# Patient Record
Sex: Female | Born: 1985 | Race: White | Hispanic: No | Marital: Married | Smoking: Never smoker
Health system: Southern US, Community
[De-identification: ages and names within clinical notes are randomized; demographics above are authoritative.]

## PROBLEM LIST (undated history)

## (undated) ENCOUNTER — Inpatient Hospital Stay (HOSPITAL_COMMUNITY): Payer: Self-pay

## (undated) DIAGNOSIS — K219 Gastro-esophageal reflux disease without esophagitis: Secondary | ICD-10-CM

## (undated) DIAGNOSIS — Z789 Other specified health status: Secondary | ICD-10-CM

## (undated) DIAGNOSIS — O139 Gestational [pregnancy-induced] hypertension without significant proteinuria, unspecified trimester: Secondary | ICD-10-CM

---

## 2005-10-28 ENCOUNTER — Ambulatory Visit: Payer: Self-pay | Admitting: Family Medicine

## 2005-12-31 ENCOUNTER — Ambulatory Visit: Payer: Self-pay | Admitting: Family Medicine

## 2011-05-11 ENCOUNTER — Inpatient Hospital Stay (HOSPITAL_COMMUNITY)
Admission: AD | Admit: 2011-05-11 | Discharge: 2011-05-11 | Disposition: A | Discharge status: Home / Self Care | Payer: BC Managed Care – PPO | Source: Ambulatory Visit | Attending: Obstetrics and Gynecology | Admitting: Obstetrics and Gynecology

## 2011-05-11 DIAGNOSIS — O139 Gestational [pregnancy-induced] hypertension without significant proteinuria, unspecified trimester: Secondary | ICD-10-CM | POA: Insufficient documentation

## 2011-05-11 LAB — LACTATE DEHYDROGENASE: LDH: 314 U/L — ABNORMAL HIGH (ref 94–250)

## 2011-05-11 LAB — COMPREHENSIVE METABOLIC PANEL
ALT: 25 U/L (ref 0–35)
Alkaline Phosphatase: 165 U/L — ABNORMAL HIGH (ref 39–117)
BUN: 13 mg/dL (ref 6–23)
CO2: 18 mEq/L — ABNORMAL LOW (ref 19–32)
GFR calc non Af Amer: >60 mL/min (ref >60)
Glucose, Bld: 90 mg/dL (ref 70–99)
Potassium: 4.3 mEq/L (ref 3.5–5.1)
Sodium: 135 mEq/L (ref 135–145)
Total Bilirubin: 0.3 mg/dL (ref 0.3–1.2)
Total Protein: 6.4 g/dL (ref 6.0–8.3)

## 2011-05-11 LAB — CBC
HCT: 34.8 % — ABNORMAL LOW (ref 36.0–46.0)
Hemoglobin: 12.4 g/dL (ref 12.0–15.0)
MCV: 89.5 fL (ref 78.0–100.0)
RDW: 12.7 % (ref 11.5–15.5)
WBC: 10.3 10*3/uL (ref 4.0–10.5)

## 2011-05-11 LAB — URIC ACID: Uric Acid, Serum: 3.6 mg/dL (ref 2.4–7.0)

## 2011-06-03 ENCOUNTER — Inpatient Hospital Stay (HOSPITAL_COMMUNITY)
Admission: AD | Admit: 2011-06-03 | Discharge: 2011-06-08 | DRG: 370 | Disposition: A | Discharge status: Home / Self Care | Payer: BC Managed Care – PPO | Source: Ambulatory Visit | Attending: Obstetrics and Gynecology | Admitting: Obstetrics and Gynecology

## 2011-06-03 DIAGNOSIS — D689 Coagulation defect, unspecified: Secondary | ICD-10-CM | POA: Diagnosis present

## 2011-06-03 DIAGNOSIS — I10 Essential (primary) hypertension: Secondary | ICD-10-CM | POA: Diagnosis present

## 2011-06-03 DIAGNOSIS — IMO0002 Reserved for concepts with insufficient information to code with codable children: Secondary | ICD-10-CM | POA: Diagnosis present

## 2011-06-03 DIAGNOSIS — D696 Thrombocytopenia, unspecified: Secondary | ICD-10-CM | POA: Diagnosis present

## 2011-06-03 DIAGNOSIS — O9912 Other diseases of the blood and blood-forming organs and certain disorders involving the immune mechanism complicating childbirth: Secondary | ICD-10-CM | POA: Diagnosis present

## 2011-06-03 LAB — URINALYSIS, ROUTINE W REFLEX MICROSCOPIC
Bilirubin Urine: NEGATIVE
Hgb urine dipstick: NEGATIVE
Ketones, ur: NEGATIVE mg/dL
Nitrite: NEGATIVE
Protein, ur: NEGATIVE mg/dL
Urobilinogen, UA: 0.2 mg/dL (ref 0.0–1.0)

## 2011-06-03 LAB — CBC
MCH: 31 pg (ref 26.0–34.0)
MCHC: 34.3 g/dL (ref 30.0–36.0)
MCV: 90.4 fL (ref 78.0–100.0)
Platelets: 130 10*3/uL — ABNORMAL LOW (ref 150–400)
RBC: 4.06 MIL/uL (ref 3.87–5.11)
RDW: 13 % (ref 11.5–15.5)

## 2011-06-03 LAB — URIC ACID: Uric Acid, Serum: 4.6 mg/dL (ref 2.4–7.0)

## 2011-06-03 LAB — COMPREHENSIVE METABOLIC PANEL
AST: 26 U/L (ref 0–37)
CO2: 20 mEq/L (ref 19–32)
Calcium: 9.5 mg/dL (ref 8.4–10.5)
Creatinine, Ser: 0.85 mg/dL (ref 0.50–1.10)
GFR calc Af Amer: >60 mL/min (ref >60)
GFR calc non Af Amer: >60 mL/min (ref >60)
Sodium: 131 mEq/L — ABNORMAL LOW (ref 135–145)
Total Protein: 6.4 g/dL (ref 6.0–8.3)

## 2011-06-04 ENCOUNTER — Other Ambulatory Visit: Payer: Self-pay | Admitting: Obstetrics and Gynecology

## 2011-06-04 LAB — CBC
HCT: 36.7 % (ref 36.0–46.0)
Platelets: 134 10*3/uL — ABNORMAL LOW (ref 150–400)
RBC: 4.1 MIL/uL (ref 3.87–5.11)
RDW: 12.9 % (ref 11.5–15.5)
WBC: 14.6 10*3/uL — ABNORMAL HIGH (ref 4.0–10.5)

## 2011-06-05 LAB — CBC
Hemoglobin: 9.4 g/dL — ABNORMAL LOW (ref 12.0–15.0)
Platelets: 105 10*3/uL — ABNORMAL LOW (ref 150–400)
RBC: 2.97 MIL/uL — ABNORMAL LOW (ref 3.87–5.11)
WBC: 13.4 10*3/uL — ABNORMAL HIGH (ref 4.0–10.5)

## 2011-06-05 LAB — RPR: RPR Ser Ql: NONREACTIVE

## 2011-06-08 LAB — COMPREHENSIVE METABOLIC PANEL
ALT: 35 U/L (ref 0–35)
Alkaline Phosphatase: 142 U/L — ABNORMAL HIGH (ref 39–117)
CO2: 22 mEq/L (ref 19–32)
Chloride: 101 mEq/L (ref 96–112)
GFR calc Af Amer: >60 mL/min (ref >60)
GFR calc non Af Amer: >60 mL/min (ref >60)
Glucose, Bld: 81 mg/dL (ref 70–99)
Potassium: 4 mEq/L (ref 3.5–5.1)
Sodium: 133 mEq/L — ABNORMAL LOW (ref 135–145)
Total Bilirubin: 0.1 mg/dL — ABNORMAL LOW (ref 0.3–1.2)

## 2011-06-08 LAB — CBC
Hemoglobin: 9 g/dL — ABNORMAL LOW (ref 12.0–15.0)
RBC: 2.98 MIL/uL — ABNORMAL LOW (ref 3.87–5.11)

## 2011-06-12 NOTE — Op Note (Signed)
NAMESHAUGHNESSY, GETHERS          ACCOUNT NO.:  [id number]  MEDICAL RECORD NO.:  [id number]  LOCATION:  9131                          FACILITY:  [facility]  PHYSICIAN:  Duke Salvia. Marcelle Overlie, M.D.DATE OF BIRTH:  [birth year]  DATE OF PROCEDURE:  06/04/2011 DATE OF DISCHARGE:                              OPERATIVE REPORT   PREOPERATIVE DIAGNOSES: 1. 37+ week intrauterine pregnancy. 2. Chronic hypertension. 3. Mild preeclampsia. 4. Failed induction. 5. Fetal intolerance to labor.  POSTOPERATIVE DIAGNOSES: 1. 37+ week intrauterine pregnancy. 2. Chronic hypertension. 3. Mild preeclampsia. 4. Failed induction. 5. Fetal intolerance to labor.  PROCEDURE:  Primary low transverse cesarean section.  SURGEON:  Duke Salvia. Marcelle Overlie, MD.  ANESTHESIA:  Spinal.  COMPLICATIONS:  None.  DRAINS:  Foley catheter.  BLOOD LOSS:  700 mL.  INDICATION:  This patient was admitted for induction at 37+ weeks with a platelet count that was low normal at 130,000, BP 160/100, on labetalol, complaining of headache.  She received Cytotec overnight and Pitocin during the day.  In the afternoon of day #1 of induction, she had some late decelerations on Pitocin, she was given O2 and Pitocin was discontinued.  She was still long and close, at that point, we discussed further attempts of Pitocin induction.  The patient and her husband were ready for cesarean section for failed induction at that point.  DESCRIPTION OF PROCEDURE:  The patient was taken to the operating room after adequate level of spinal anesthetic was obtained.  With the patient in left tilt position, the abdomen was prepped and draped in usual manner for sterile bowel procedures.  Foley catheter positioned and draining clear urine.  Transverse incision was made two fingerbreadths above the symphysis, carried down to the fascia which was incised and extended transversely.  Rectus muscle was divided in the midline.  Peritoneum entered superiorly  without incident and extended in a vertical fashion.  The vesicouterine serosa was then incised and the bladder was bluntly and sharply dissected below.  Bladder blade was repositioned.  Transverse incision made in the lower uterine segment. Clear fluid noted, extended with blunt dissection.  The patient delivered of a female, Apgars 4 and 8, cord pH 7.20, weight 6 pounds 10 ounces, in the OP presentation.  This required partial rotation followed by VE to easily extract the vertex for delivery.  The infant was suctioned, cord was clamped, and passed to pediatric team for further care.  Placenta was then delivered manually intact, sent to pathology. Uterus exteriorized, cavity wiped clean with laparotomy pack.  Closure obtained with first layer of 0-chromic in a locked fashion followed by an imbricating layer of 0-chromic.  This was hemostatic.  Tubes and ovaries were normal.  The bladder flap area was intact and hemostatic. Clear urine noted at that point.  Prior to closure, sponge, needle, and instrument counts reported as correct x2.  Peritoneum closed with running 2-0 Vicryl suture.  2-0 Vicryl interrupted sutures used to approximate the rectus muscles in the midline.  Zero PDS suture was then used to close the fascia from laterally to midline on either side. Subcutaneous tissue was irrigated and noted be hemostatic.  A running 3- 0 Vicryl suture was used to reapproximate the  dead space in the subcu which was hemostatic.  A 4-0 Monocryl subcuticular suture with excellent closure of the skin.  She did receive Ancef 1 gram IV preop and Pitocin IV after the cord was clamped.  Mother and baby doing well at that point.     Lexton Hidalgo M. Marcelle Overlie, M.D.     RMH/MEDQ  D:  06/04/2011  T:  06/05/2011  Job:  [id number]  Electronically Signed by Richarda Overlie M.D. on 06/12/2011 09:49:58 AM

## 2011-06-17 NOTE — Discharge Summary (Signed)
Diana Burnett, Diana Burnett          ACCOUNT NO.:  [id number]  MEDICAL RECORD NO.:  [id number]  LOCATION:  9131                          FACILITY:  [facility]  PHYSICIAN:  Guy Sandifer. Henderson Cloud, M.D. DATE OF BIRTH:  1986-05-19  DATE OF ADMISSION:  06/03/2011 DATE OF DISCHARGE:  06/08/2011                              DISCHARGE SUMMARY   ADMITTING DIAGNOSES: 1. Intrauterine pregnancy at 37-2/7 weeks estimated gestational age. 2. Chronic hypertension. 3. Suspicion for preeclampsia. 4. Induction of labor.  DISCHARGE DIAGNOSES: 1. Status post low transverse cesarean section as secondary to failed     induction and fetal intolerance to labor. 2. Viable female infant.  PROCEDURE:  Primary low transverse cesarean section.  REASON FOR ADMISSION:  Please see dictated H&P.  HOSPITAL COURSE:  The patient is a 25 year old white married female, primigravida was admitted to [facility] at 37-2/7 weeks estimated gestational age for induction of labor.  The patient's pregnancy had been complicated by elevation in blood pressure.  The patient was started on labetalol for control and on the day of admission, blood pressure was noted to be 160/100.  The patient did have trace of proteinuria and was complaining of worsening headache.  The patient was then sent over to MAU for further evaluation.  Her PIH labs did reveal a low platelet count of 130,000 and liver function tests were within normal limits.  Due to worsening blood pressures at 37+ weeks and slightly low platelet count, decision was made to admit the patient for induction of labor.  That evening the patient was given Cytotec and the following morning the patient was started on Pitocin for augmentation of her labor.  Later in the afternoon, late decelerations were noted on the monitor which did recover with O2 administration, cessation of the Pitocin and positional change.  However, decision was made to proceed with a primary low  transverse cesarean section.  The patient was now transferred to the operating room where spinal anesthesia was administered without difficulty.  A low transverse incision was made with the delivery of a viable female infant weighing 6 pounds 10 ounces with Apgar's of 4 at 1 minute, 8 at 5 minutes.  Arterial cord pH was 7.20.  The patient tolerated the procedure well and taken to the recovery room in stable condition.  On the following morning, baby had been transferred to the NICU as the baby had coded in the room while mom was changing the diaper. Neuro evaluation was being performed.  Mother was tearful.Her vital signs were stable and she was afebrile.  Abdomen was soft, fundus firm and nontender.  Abdominal dressing noted to be clean, dry, and intact. Foley had been discontinued.  However, she did not void at the time of rounding.  Laboratory findings revealed hemoglobin 9.4, platelet count of 105,000.  The patient was started on Zoloft on the following morning.  The patient was without complaint.  Baby remained in the NICU. Abdomen soft.  Fundus firm and nontender.  Hemoglobin was noted to be 9.4.  On postoperative day #3, the patient was without complaint.  Vital signs were stable.  Fundus firm and nontender.  Incision was clean, dry and intact.  She is ambulating well.  On postoperative day #4, the patient complained of some pedal edema.  She denied headache or blurred vision, right upper quadrant pain.  Baby was stable in the NICU and the code was thought to be related to low blood sugar.  The patient's vital signs were stable.  Blood pressures were 120/81 to 144/86.  Deep tendon reflexes were 1+, no clonus, 2+ pedal edema was observed.  Abdomen soft with good return of bowel function.  Fundus firm and nontender. Incision was clean, dry, and intact with subcuticular closure.  PIH labs were drawn prior to the patient's discharge. Instructions were reviewed and the patient was later  discharged home.  CONDITION ON DISCHARGE:  Stable.  DIET:  Regular as tolerated.  ACTIVITY:  No heavy lifting, no driving x2 weeks, no vaginal entry.  FOLLOWUP:  The patient to follow up in the office in 3-4 days for an incision check.  She is to call for temperature greater than 100 degrees, persistent nausea, vomiting, heavy vaginal bleeding and/or redness or drainage from incisional site.  DISCHARGE MEDICATIONS: 1. Zoloft 25 mg 1 p.o. daily. 2. Labetalol 200 mg 1 p.o. b.i.d. 3. Percocet 5/325, #30, 1 p.o. every 4-6 h. p.r.n. 4. Prenatal vitamins 1 p.o. daily.     Julio Sicks, N.P.   ______________________________ Guy Sandifer. Henderson Cloud, M.D.    CC/MEDQ  D:  06/08/2011  T:  06/08/2011  Job:  [id number]  Electronically Signed by Julio Sicks N.P. on 06/09/2011 08:37:11 AM Electronically Signed by Harold Hedge M.D. on 06/17/2011 08:03:01 AM

## 2011-06-22 ENCOUNTER — Inpatient Hospital Stay (HOSPITAL_COMMUNITY)
Admission: AD | Admit: 2011-06-22 | Payer: BC Managed Care – PPO | Source: Home / Self Care | Admitting: Obstetrics and Gynecology

## 2011-06-29 NOTE — H&P (Signed)
  NAME:  MAKHIA, VOSLER NO.:  [id number]  MEDICAL RECORD NO.:  [id number]  LOCATION:                                 FACILITY:  PHYSICIAN:  Juluis Mire, M.D.        DATE OF BIRTH:  DATE OF ADMISSION:  06/03/2011 DATE OF DISCHARGE:                             HISTORY & PHYSICAL   The patient is a 25 year old primigravida female, estimated date of confinement is July 16.  This gives her estimated gestational age of 12- 2/7.  Her prenatal course has been complicated by elevated blood pressures.  She has been on increasing doses of labetalol.  She was seen today in the office where her blood pressure was 160/100.  She only had a trace of protein but was complaining of worsening headaches.  She was sent over to MAU for evaluation.  Her PIH labs did reveal a low platelet count of 130,000.  Liver function tests were all normal.  In view of the worsening blood pressure at 37+ weeks and slightly low platelet count, decision was to admit the patient for induction of labor.  In terms of allergies, has no known drug allergies.  MEDICATIONS: 1. Labetalol 100 mg b.i.d. 2. Prenatal vitamins.  For past medical history, family history, and social history, please see prenatal records.  REVIEW OF SYSTEMS:  Noncontributory.  PHYSICAL EXAMINATION:  VITAL SIGNS:  Again, the patient's blood pressure in the office was 160/100.  Her vital signs are stable. LUNGS:  Clear. CARDIOVASCULAR:  Regular rhythm and rate.  There are no murmurs or gallops. ABDOMEN:  Gravid uterus consistent with dates. PELVIC:  Cervix is long and closed. EXTREMITIES:  Trace edema. NEUROLOGIC:  Grossly within normal limits.  Deep tendon reflexes 2+ with no clonus.  IMPRESSION: 1. Intrauterine pregnancy at 37-2/7 with chronic hypertension versus     preeclampsia. 2. Mild thrombocytopenia.  PLAN:  In view of the patient's platelets and elevated blood pressure of 37 plus weeks, the decision was to  bring the patient in for Cytotec ripening of the cervix and subsequent induction of labor.  Her group B strep is negative.     Juluis Mire, M.D.     JSM/MEDQ  D:  06/03/2011  T:  06/04/2011  Job:  [id number]  Electronically Signed by Richardean Chimera M.D. on 06/29/2011 06:10:34 AM

## 2014-10-25 ENCOUNTER — Other Ambulatory Visit: Payer: Self-pay | Admitting: Obstetrics and Gynecology

## 2014-10-30 LAB — CYTOLOGY - PAP

## 2015-07-30 ENCOUNTER — Other Ambulatory Visit: Payer: Self-pay | Admitting: Obstetrics and Gynecology

## 2015-07-31 LAB — CYTOLOGY - PAP

## 2015-10-10 ENCOUNTER — Inpatient Hospital Stay (HOSPITAL_COMMUNITY)
Admission: AD | Admit: 2015-10-10 | Discharge: 2015-10-10 | Disposition: A | Discharge status: Home / Self Care | Payer: Commercial Managed Care - PPO | Source: Ambulatory Visit | Attending: Obstetrics and Gynecology | Admitting: Obstetrics and Gynecology

## 2015-10-10 ENCOUNTER — Encounter (HOSPITAL_COMMUNITY): Payer: Self-pay | Admitting: *Deleted

## 2015-10-10 ENCOUNTER — Inpatient Hospital Stay (HOSPITAL_COMMUNITY): Payer: Commercial Managed Care - PPO

## 2015-10-10 DIAGNOSIS — S3991XA Unspecified injury of abdomen, initial encounter: Secondary | ICD-10-CM

## 2015-10-10 DIAGNOSIS — O9A219 Injury, poisoning and certain other consequences of external causes complicating pregnancy, unspecified trimester: Secondary | ICD-10-CM

## 2015-10-10 DIAGNOSIS — O34219 Maternal care for unspecified type scar from previous cesarean delivery: Secondary | ICD-10-CM | POA: Insufficient documentation

## 2015-10-10 DIAGNOSIS — O26892 Other specified pregnancy related conditions, second trimester: Secondary | ICD-10-CM | POA: Diagnosis present

## 2015-10-10 DIAGNOSIS — Z3A20 20 weeks gestation of pregnancy: Secondary | ICD-10-CM | POA: Insufficient documentation

## 2015-10-10 DIAGNOSIS — R109 Unspecified abdominal pain: Secondary | ICD-10-CM | POA: Insufficient documentation

## 2015-10-10 DIAGNOSIS — O9A212 Injury, poisoning and certain other consequences of external causes complicating pregnancy, second trimester: Secondary | ICD-10-CM | POA: Diagnosis not present

## 2015-10-10 DIAGNOSIS — X58XXXA Exposure to other specified factors, initial encounter: Secondary | ICD-10-CM | POA: Diagnosis not present

## 2015-10-10 HISTORY — DX: Other specified health status: Z78.9

## 2015-10-10 LAB — KLEIHAUER-BETKE STAIN
# Vials RhIg: 1
FETAL CELLS %: 0 %
QUANTITATION FETAL HEMOGLOBIN: 0 mL

## 2015-10-10 NOTE — MAU Note (Signed)
Urine in lab 

## 2015-10-10 NOTE — MAU Provider Note (Signed)
Chief Complaint:  Abdominal Injury   None     HPI: Diana Burnett is a 29 y.o. G2P1001 at [redacted]w[redacted]d who presents to maternity admissions reporting her 79 year old son fell while standing on the couch and stepped into her abdomen forcefully.  Afterwards she did notice some mild shooting pain on the right lower side and did not feel as much baby movement as usual.  She called the office and was sent to MAU for further evaluation.  She denies LOF, vaginal bleeding, vaginal itching/burning, urinary symptoms, h/a, dizziness, n/v, or fever/chills.    HPI  Past Medical History: Past Medical History  Diagnosis Date  . Medical history non-contributory     Past obstetric history: OB History  Gravida Para Term Preterm AB SAB TAB Ectopic Multiple Living  # Outcome Date GA Lbr Len/2nd Weight Sex Delivery Anes PTL Lv  2 Current           1 Term               Past Surgical History: Past Surgical History  Procedure Laterality Date  . Cesarean section      Family History: History reviewed. No pertinent family history.  Social History: Social History  Substance Use Topics  . Smoking status: Never Smoker   . Smokeless tobacco: None  . Alcohol Use: No    Allergies: No Known Allergies  Meds:  Prescriptions prior to admission  Medication Sig Dispense Refill Last Dose  . aspirin 81 MG chewable tablet Chew 81 mg by mouth daily.   10/10/2015 at Unknown time  . Prenatal Vit-Fe Fumarate-FA (PRENATAL MULTIVITAMIN) TABS tablet Take 1 tablet by mouth daily at 12 noon.   10/10/2015 at Unknown time    ROS:  Review of Systems  Constitutional: Negative for fever, chills and fatigue.  HENT: Negative for sinus pressure.   Eyes: Negative for photophobia.  Respiratory: Negative for shortness of breath.   Cardiovascular: Negative for chest pain.  Gastrointestinal: Negative for nausea, vomiting, diarrhea and constipation.  Genitourinary: Negative for dysuria, frequency, flank  pain, vaginal bleeding, vaginal discharge, difficulty urinating, vaginal pain and pelvic pain.  Musculoskeletal: Negative for neck pain.  Neurological: Negative for dizziness, weakness and headaches.  Psychiatric/Behavioral: Negative.      I have reviewed patient's Past Medical Hx, Surgical Hx, Family Hx, Social Hx, medications and allergies.   Physical Exam   Patient Vitals for the past 24 hrs:  BP Temp Temp src Pulse Resp  10/10/15 1155 119/79 mmHg 97.5 F (36.4 C) Oral 81 18   Constitutional: Well-developed, well-nourished female in no acute distress.  Cardiovascular: normal rate Respiratory: normal effort GI: Abd soft, non-tender, gravid appropriate for gestational age.  MS: Extremities nontender, no edema, normal ROM Neurologic: Alert and oriented x 4.  GU: Neg CVAT.     FHT:  153 by doppler  Labs: No results found for this or any previous visit (from the past 24 hour(s)).    Kleihauer-Betke pending  Imaging:   MAU Course/MDM: I have ordered labs and reviewed results.  Consult Dr Henderson Cloud to review assessment and imaging.  Ordered KB lab, but Ok to discharge pt with results pending.  Pt stable at time of discharge.  Assessment: 1. [redacted] weeks gestation of pregnancy   2. Pregnancy with abdominal wall injury, antepartum   3. Previous cesarean section complicating pregnancy, with delivery     Plan: Discharge home Reassurance  provided to pt Precautions given, reasons to return to hospital       Follow-up Information    [facility] HemII,JAMES E, MD.   Specialty:  Obstetrics and Gynecology   Why:  As scheduled, Return to MAU as needed for emergencies   Contact information:   [address] [place] [id number] [phone]        Medication List    TAKE these medications        aspirin 81 MG chewable tablet  Chew 81 mg by mouth daily.     prenatal multivitamin Tabs tablet  Take 1 tablet by mouth daily at 12 noon.         Sharen CounterLisa Leftwich-Kirby Certified Nurse-Midwife 10/10/2015 1:58 PM

## 2015-10-10 NOTE — MAU Note (Signed)
Son was jumping beside her, lost balance and landed on rt side of abd.  Was a little achy last night and today.  Some decrease in fetal movement. No bleeding.

## 2015-10-10 NOTE — Discharge Instructions (Signed)
What Do I Need to Know About Injuries During Pregnancy? °Trauma is the most common cause of injury and death in pregnant women. This can also result in significant harm or death of the baby. °Your baby is protected in the womb (uterus) by a sac filled with fluid (amniotic sac). Your baby can be harmed if there is direct, high-impact trauma to your abdomen and pelvis. This type of trauma can result in tearing of your uterus, the placenta pulling away from the wall of the uterus (placenta abruption), or the amniotic sac breaking open (rupture of membranes). These injuries can decrease or stop the blood supply to your baby or cause you to go into labor earlier than expected. Minor falls and low-impact automobile accidents do not usually harm your baby, even if they do minimally harm you. °WHAT KIND OF INJURIES CAN AFFECT MY PREGNANCY? °The most common causes of injury or death to a baby include: °· Falls. Falls are more common in the second and third trimester of the pregnancy. Factors that increase your risk of falling include: °¨ Increase in your weight. °¨ The change in your center of gravity. °¨ Tripping over an object that cannot be seen. °¨ Increased looseness (laxity) of your ligaments resulting in less coordinated movements (you may feel clumsy). °¨ Falling during high-risk activities like horseback riding or skiing. °· Automobile accidents. It is important to wear your seat belt properly, with the lap belt below your abdomen, and always practice safe driving. °· Domestic violence or assault. °· Burns (fire or electrical). °The most common causes of injury or death to the pregnant woman include: °· Injuries that cause severe bleeding, shock, and loss of blood flow to major organs. °· Head and neck injuries that result in severe brain or spinal damage. °· Chest trauma that can cause direct injury to the heart and lungs or any injury that affects the area enclosed by the ribs. Trauma to this area can result in  cardiorespiratory arrest. °WHAT CAN I DO TO PROTECT MYSELF AND MY BABY FROM INJURY WHILE I AM PREGNANT? °· Remove slippery rugs and loose objects on the floor that increase your risk of tripping. °· Avoid walking on wet or slippery floors. °· Wear comfortable shoes that have a good grip on the sole. Do not wear high-heeled shoes. °· Always wear your seat belt properly, with the lap belt below your abdomen, and always practice safe driving. Do not ride on a motorcycle while pregnant. °· Do not participate in high-impact activities or sports. °· Avoid fires, starting fires, lifting heavy pots of boiling or hot liquids, and fixing electrical problems. °· Only take over-the-counter or prescription medicines for pain, fever, or discomfort as directed by your health care provider. °· Know your blood type and the father's blood type in case you develop vaginal bleeding or experience an injury for which a blood transfusion may be necessary. °· Call your local emergency services (911 in the [place] if you are a victim of domestic violence or assault. Spousal abuse can be a significant cause of trauma during pregnancy. For help and support, contact the National Domestic Violence Hotline. °WHEN SHOULD I SEEK IMMEDIATE MEDICAL CARE?  °· You fall on your abdomen or experience any high-force accident or injury. °· You have been assaulted (domestic or otherwise). °· You have been in a car accident. °· You develop vaginal bleeding. °· You develop fluid leaking from the vagina. °· You develop uterine contractions (pelvic cramping, pain, or significant low back   pain). °· You become weak or faint, or have uncontrolled vomiting after trauma. °· You had a serious burn. This includes burns to the face, neck, hands, or genitals, or burns greater than the size of your palm anywhere else. °· You develop neck stiffness or pain after a fall or from other trauma. °· You develop a headache or vision problems after a fall or from other  trauma. °· You do not feel the baby moving or the baby is not moving as much as before a fall or other trauma. °  °This information is not intended to replace advice given to you by your health care provider. Make sure you discuss any questions you have with your health care provider. °  °Document Released: 12/31/2004 Document Revised: 12/14/2014 Document Reviewed: 08/30/2013 °Elsevier Interactive Patient Education ©2016 [employer] ° °

## 2015-11-28 ENCOUNTER — Encounter (HOSPITAL_COMMUNITY): Payer: Self-pay | Admitting: *Deleted

## 2015-11-28 ENCOUNTER — Inpatient Hospital Stay (HOSPITAL_COMMUNITY)
Admission: AD | Admit: 2015-11-28 | Discharge: 2015-11-29 | Disposition: A | Discharge status: Home / Self Care | Payer: Commercial Managed Care - PPO | Source: Intra-hospital | Attending: Obstetrics and Gynecology | Admitting: Obstetrics and Gynecology

## 2015-11-28 DIAGNOSIS — O9989 Other specified diseases and conditions complicating pregnancy, childbirth and the puerperium: Secondary | ICD-10-CM | POA: Insufficient documentation

## 2015-11-28 DIAGNOSIS — Z79899 Other long term (current) drug therapy: Secondary | ICD-10-CM | POA: Diagnosis not present

## 2015-11-28 DIAGNOSIS — N898 Other specified noninflammatory disorders of vagina: Secondary | ICD-10-CM | POA: Insufficient documentation

## 2015-11-28 DIAGNOSIS — N939 Abnormal uterine and vaginal bleeding, unspecified: Secondary | ICD-10-CM | POA: Diagnosis present

## 2015-11-28 DIAGNOSIS — K219 Gastro-esophageal reflux disease without esophagitis: Secondary | ICD-10-CM | POA: Diagnosis not present

## 2015-11-28 DIAGNOSIS — O99612 Diseases of the digestive system complicating pregnancy, second trimester: Secondary | ICD-10-CM | POA: Insufficient documentation

## 2015-11-28 DIAGNOSIS — Z7982 Long term (current) use of aspirin: Secondary | ICD-10-CM | POA: Diagnosis not present

## 2015-11-28 DIAGNOSIS — Z3A27 27 weeks gestation of pregnancy: Secondary | ICD-10-CM | POA: Diagnosis not present

## 2015-11-28 HISTORY — DX: Gestational (pregnancy-induced) hypertension without significant proteinuria, unspecified trimester: O13.9

## 2015-11-28 HISTORY — DX: Gastro-esophageal reflux disease without esophagitis: K21.9

## 2015-11-28 LAB — URINALYSIS, ROUTINE W REFLEX MICROSCOPIC
BILIRUBIN URINE: NEGATIVE
GLUCOSE, UA: NEGATIVE mg/dL
KETONES UR: NEGATIVE mg/dL
Leukocytes, UA: NEGATIVE
Nitrite: NEGATIVE
PH: 5.5 (ref 5.0–8.0)
Protein, ur: NEGATIVE mg/dL

## 2015-11-28 LAB — WET PREP, GENITAL
SPERM: NONE SEEN
TRICH WET PREP: NONE SEEN
WBC WET PREP: NONE SEEN
YEAST WET PREP: NONE SEEN

## 2015-11-28 LAB — POCT FERN TEST: POCT Fern Test: NEGATIVE

## 2015-11-28 LAB — URINE MICROSCOPIC-ADD ON
RBC / HPF: NONE SEEN RBC/hpf (ref 0–5)
WBC, UA: NONE SEEN WBC/hpf (ref 0–5)

## 2015-11-28 NOTE — MAU Note (Signed)
Pt awoke with wetness in her underware and her bed sheets, she got up and voided and did not notice any leaking all day.  While at dinner tonight, she felt something come out and noticed a few drops of pink blood in her underware.  Reports good fetal  Movement.

## 2015-11-28 NOTE — MAU Provider Note (Signed)
History     CSN: [id number]  Arrival date and time: 11/28/15 2244   First Provider Initiated Contact with Patient 11/28/15 2315      Chief Complaint  Patient presents with  . Vaginal Bleeding   HPI Comments: Diana Burnett is a 29 y.o. G2P1001 at [redacted]w[redacted]d who presents today with leaking of fluid. She states that when she woke up this morning her bed was soaked with wetness. She did not leak for the rest of the day until around dinnertime. At that point she went to the bathroom, and had some pink spotting. She denies any abdominal pain or contractions at this time. She states that she thinks she had some the other day. She states that the fetus has been active. She denies any complications with this pregnancy. She had hypertension with her last pregnancy, and was induced at 37 weeks and had a c-section for fetal intolerance.   Vaginal Bleeding The patient's primary symptoms include vaginal bleeding. This is a new problem. The current episode started today. The problem occurs rarely. The problem has been resolved. The patient is experiencing no pain. She is pregnant. Vaginal bleeding amount: one episode of pink spotting. None since.  She has not been passing clots. She has not been passing tissue.   Past Medical History  Diagnosis Date  . Medical history non-contributory   . Pregnancy induced hypertension   . GERD (gastroesophageal reflux disease)     Past Surgical History  Procedure Laterality Date  . Cesarean section      History reviewed. No pertinent family history.  Social History  Substance Use Topics  . Smoking status: Never Smoker   . Smokeless tobacco: None  . Alcohol Use: No    Allergies: No Known Allergies  Prescriptions prior to admission  Medication Sig Dispense Refill Last Dose  . aspirin 81 MG chewable tablet Chew 81 mg by mouth daily.   11/27/2015 at Unknown time  . Prenatal Vit-Fe Fumarate-FA (PRENATAL MULTIVITAMIN) TABS tablet Take 1 tablet by mouth  daily at 12 noon.   11/27/2015 at Unknown time  . ranitidine (ZANTAC) 150 MG capsule Take 150 mg by mouth 2 (two) times daily.   11/28/2015 at Unknown time    Review of Systems  Genitourinary: Positive for vaginal bleeding.   Physical Exam   Blood pressure 133/83, pulse 91, temperature 97.7 F (36.5 C), temperature source Oral, resp. rate 16, height  (1.727 m), weight 96.163 kg (212 lb).  Physical Exam  Nursing note and vitals reviewed. Constitutional: She is oriented to person, place, and time. She appears well-developed and well-nourished. No distress.  HENT:  Head: Normocephalic.  Cardiovascular: Normal rate.   Respiratory: Effort normal.  GI: Soft. There is no tenderness. There is no rebound.  Genitourinary:   External: no lesion Vagina: large amount of white discharge. No pooling  Cervix: pink, smooth, closed/thick/high  Uterus: AGA   Neurological: She is alert and oriented to person, place, and time.  Skin: Skin is warm and dry.  Psychiatric: She has a normal mood and affect.    Results for orders placed or performed during the hospital encounter of 11/28/15 (from the past 24 hour(s))  Urinalysis, Routine w reflex microscopic (not at [facility])     Status: Abnormal   Collection Time: 11/28/15 10:40 PM  Result Value Ref Range   Color, Urine YELLOW YELLOW   APPearance CLEAR CLEAR   Specific Gravity, Urine <1.005 (L) 1.005 - 1.030   pH 5.5 5.0 -  8.0   Glucose, UA NEGATIVE NEGATIVE mg/dL   Hgb urine dipstick TRACE (A) NEGATIVE   Bilirubin Urine NEGATIVE NEGATIVE   Ketones, ur NEGATIVE NEGATIVE mg/dL   Protein, ur NEGATIVE NEGATIVE mg/dL   Nitrite NEGATIVE NEGATIVE   Leukocytes, UA NEGATIVE NEGATIVE  Urine microscopic-add on     Status: Abnormal   Collection Time: 11/28/15 10:40 PM  Result Value Ref Range   Squamous Epithelial / LPF 0-5 (A) NONE SEEN   WBC, UA NONE SEEN 0 - 5 WBC/hpf   RBC / HPF NONE SEEN 0 - 5 RBC/hpf   Bacteria, UA RARE (A) NONE SEEN  Wet  prep, genital     Status: Abnormal   Collection Time: 11/28/15 11:30 PM  Result Value Ref Range   Yeast Wet Prep HPF POC NONE SEEN NONE SEEN   Trich, Wet Prep NONE SEEN NONE SEEN   Clue Cells Wet Prep HPF POC PRESENT (A) NONE SEEN   WBC, Wet Prep HPF POC NONE SEEN NONE SEEN   Sperm NONE SEEN   POCT fern test     Status: None   Collection Time: 11/28/15 11:32 PM  Result Value Ref Range   POCT Fern Test Negative = intact amniotic membranes    FHT: 150, moderate with 15x15 accels, no decels Toco: no UCs  MAU Course  Procedures  MDM 0026: D/W Dr. Renaldo FiddlerAdkins, ok for DC home.   Assessment and Plan   1. Vaginal discharge   2. [redacted] weeks gestation of pregnancy    DC home Comfort measures reviewed  3rd Trimester precautions  PTL precautions  Fetal kick counts RX: none  Return to MAU as needed   Follow-up Information    Follow up with Zelphia CairoADKINS,GRETCHEN, MD.   Specialty:  Obstetrics and Gynecology   Why:  As scheduled   Contact information:   [address], [address] [place] [id number] [phone]         Tawnya CrookHogan, Layann Bluett Donovan 11/28/2015, 11:17 PM

## 2015-11-29 DIAGNOSIS — O9989 Other specified diseases and conditions complicating pregnancy, childbirth and the puerperium: Secondary | ICD-10-CM | POA: Diagnosis not present

## 2015-11-29 DIAGNOSIS — N898 Other specified noninflammatory disorders of vagina: Secondary | ICD-10-CM | POA: Diagnosis not present

## 2015-11-29 DIAGNOSIS — Z3A27 27 weeks gestation of pregnancy: Secondary | ICD-10-CM | POA: Diagnosis not present

## 2015-11-29 NOTE — Discharge Instructions (Signed)

## 2016-01-30 ENCOUNTER — Inpatient Hospital Stay (HOSPITAL_COMMUNITY)
Admission: AD | Admit: 2016-01-30 | Discharge: 2016-02-03 | DRG: 766 | Disposition: A | Discharge status: Home / Self Care | Payer: Commercial Managed Care - PPO | Source: Ambulatory Visit | Attending: Obstetrics & Gynecology | Admitting: Obstetrics & Gynecology

## 2016-01-30 ENCOUNTER — Encounter (HOSPITAL_COMMUNITY): Payer: Self-pay | Admitting: *Deleted

## 2016-01-30 ENCOUNTER — Inpatient Hospital Stay (HOSPITAL_COMMUNITY): Payer: Commercial Managed Care - PPO | Admitting: Anesthesiology

## 2016-01-30 ENCOUNTER — Encounter (HOSPITAL_COMMUNITY)
Admission: AD | Disposition: A | Discharge status: Home / Self Care | Payer: Self-pay | Source: Ambulatory Visit | Attending: Obstetrics & Gynecology

## 2016-01-30 DIAGNOSIS — O321XX0 Maternal care for breech presentation, not applicable or unspecified: Secondary | ICD-10-CM | POA: Diagnosis present

## 2016-01-30 DIAGNOSIS — K219 Gastro-esophageal reflux disease without esophagitis: Secondary | ICD-10-CM | POA: Diagnosis present

## 2016-01-30 DIAGNOSIS — O9962 Diseases of the digestive system complicating childbirth: Secondary | ICD-10-CM | POA: Diagnosis present

## 2016-01-30 DIAGNOSIS — Z98891 History of uterine scar from previous surgery: Secondary | ICD-10-CM

## 2016-01-30 DIAGNOSIS — O34219 Maternal care for unspecified type scar from previous cesarean delivery: Principal | ICD-10-CM | POA: Diagnosis present

## 2016-01-30 DIAGNOSIS — Z3A36 36 weeks gestation of pregnancy: Secondary | ICD-10-CM | POA: Diagnosis not present

## 2016-01-30 LAB — CBC
HCT: 38.3 % (ref 36.0–46.0)
Hemoglobin: 13.6 g/dL (ref 12.0–15.0)
MCH: 31.7 pg (ref 26.0–34.0)
MCHC: 35.5 g/dL (ref 30.0–36.0)
MCV: 89.3 fL (ref 78.0–100.0)
PLATELETS: 149 10*3/uL — AB (ref 150–400)
RBC: 4.29 MIL/uL (ref 3.87–5.11)
RDW: 12.5 % (ref 11.5–15.5)
WBC: 15 10*3/uL — ABNORMAL HIGH (ref 4.0–10.5)

## 2016-01-30 LAB — POCT FERN TEST: POCT Fern Test: POSITIVE

## 2016-01-30 LAB — TYPE AND SCREEN
ABO/RH(D): O POS
Antibody Screen: NEGATIVE

## 2016-01-30 SURGERY — Surgical Case
Anesthesia: Monitor Anesthesia Care

## 2016-01-30 MED ORDER — PHENYLEPHRINE 8 MG IN D5W 100 ML (0.08MG/ML) PREMIX OPTIME
INJECTION | INTRAVENOUS | Status: AC
  Filled 2016-01-30: qty 100

## 2016-01-30 MED ORDER — SCOPOLAMINE 1 MG/3DAYS TD PT72
MEDICATED_PATCH | TRANSDERMAL | Status: DC | PRN
  Administered 2016-01-30: 1 via TRANSDERMAL

## 2016-01-30 MED ORDER — FAMOTIDINE IN NACL 20-0.9 MG/50ML-% IV SOLN
20.0000 mg | Freq: Once | INTRAVENOUS | Status: AC
Start: 2016-01-30 — End: 2016-01-30
  Administered 2016-01-30: 20 mg via INTRAVENOUS
  Filled 2016-01-30: qty 50

## 2016-01-30 MED ORDER — FENTANYL CITRATE (PF) 100 MCG/2ML IJ SOLN
INTRAMUSCULAR | Status: AC
  Filled 2016-01-30: qty 2

## 2016-01-30 MED ORDER — MEPERIDINE HCL 25 MG/ML IJ SOLN
INTRAMUSCULAR | Status: AC
  Filled 2016-01-30: qty 1

## 2016-01-30 MED ORDER — LACTATED RINGERS IV BOLUS (SEPSIS)
1000.0000 mL | Freq: Once | INTRAVENOUS | Status: AC
  Administered 2016-01-30: 1000 mL via INTRAVENOUS

## 2016-01-30 MED ORDER — SCOPOLAMINE 1 MG/3DAYS TD PT72
MEDICATED_PATCH | TRANSDERMAL | Status: AC
  Filled 2016-01-30: qty 1

## 2016-01-30 MED ORDER — ONDANSETRON HCL 4 MG/2ML IJ SOLN
INTRAMUSCULAR | Status: AC
  Filled 2016-01-30: qty 2

## 2016-01-30 MED ORDER — MORPHINE SULFATE (PF) 0.5 MG/ML IJ SOLN
INTRAMUSCULAR | Status: AC
  Filled 2016-01-30: qty 10

## 2016-01-30 MED ORDER — PHENYLEPHRINE 8 MG IN D5W 100 ML (0.08MG/ML) PREMIX OPTIME
INJECTION | INTRAVENOUS | Status: DC | PRN
  Administered 2016-01-30: 60 ug/min via INTRAVENOUS

## 2016-01-30 MED ORDER — MORPHINE SULFATE (PF) 0.5 MG/ML IJ SOLN
INTRAMUSCULAR | Status: DC | PRN
  Administered 2016-01-30: .2 mg via INTRATHECAL

## 2016-01-30 MED ORDER — BUPIVACAINE IN DEXTROSE 0.75-8.25 % IT SOLN
INTRATHECAL | Status: DC | PRN
  Administered 2016-01-30: 1.6 mg via INTRATHECAL

## 2016-01-30 MED ORDER — MEPERIDINE HCL 25 MG/ML IJ SOLN
INTRAMUSCULAR | Status: DC | PRN
  Administered 2016-01-30 (×2): 12.5 mg via INTRAVENOUS

## 2016-01-30 MED ORDER — OXYTOCIN 10 UNIT/ML IJ SOLN
40.0000 [IU] | INTRAVENOUS | Status: DC | PRN
  Administered 2016-01-30: 40 [IU] via INTRAVENOUS

## 2016-01-30 MED ORDER — CEFAZOLIN SODIUM-DEXTROSE 2-3 GM-% IV SOLR
2.0000 g | INTRAVENOUS | Status: AC
  Administered 2016-01-30: 2 g via INTRAVENOUS
  Filled 2016-01-30: qty 50

## 2016-01-30 MED ORDER — ONDANSETRON HCL 4 MG/2ML IJ SOLN
INTRAMUSCULAR | Status: DC | PRN
  Administered 2016-01-30: 4 mg via INTRAVENOUS

## 2016-01-30 MED ORDER — LACTATED RINGERS IV SOLN
INTRAVENOUS | Status: DC | PRN
  Administered 2016-01-30 (×3): via INTRAVENOUS

## 2016-01-30 MED ORDER — CITRIC ACID-SODIUM CITRATE 334-500 MG/5ML PO SOLN
30.0000 mL | Freq: Once | ORAL | Status: AC
Start: 2016-01-30 — End: 2016-01-30
  Administered 2016-01-30: 30 mL via ORAL
  Filled 2016-01-30: qty 15

## 2016-01-30 MED ORDER — OXYTOCIN 10 UNIT/ML IJ SOLN
INTRAMUSCULAR | Status: AC
  Filled 2016-01-30: qty 4

## 2016-01-30 MED ORDER — LACTATED RINGERS IV SOLN
INTRAVENOUS | Status: DC | PRN
  Administered 2016-01-30: 23:00:00 via INTRAVENOUS

## 2016-01-30 MED ORDER — FENTANYL CITRATE (PF) 100 MCG/2ML IJ SOLN
INTRAMUSCULAR | Status: DC | PRN
  Administered 2016-01-30: 10 ug via INTRATHECAL

## 2016-01-30 MED ORDER — SODIUM CHLORIDE 0.9 % IR SOLN
Status: DC | PRN
Start: 2016-01-30 — End: 2016-01-30
  Administered 2016-01-30: 1000 mL

## 2016-01-30 SURGICAL SUPPLY — 33 items
CLAMP CORD UMBIL (MISCELLANEOUS) IMPLANT
CLOSURE WOUND 1/2 X4 (GAUZE/BANDAGES/DRESSINGS) ×1
CLOTH BEACON ORANGE TIMEOUT ST (SAFETY) ×3 IMPLANT
DRSG OPSITE POSTOP 4X10 (GAUZE/BANDAGES/DRESSINGS) ×3 IMPLANT
DURAPREP 26ML APPLICATOR (WOUND CARE) ×3 IMPLANT
ELECT REM PT RETURN 9FT ADLT (ELECTROSURGICAL) ×3
ELECTRODE REM PT RTRN 9FT ADLT (ELECTROSURGICAL) ×1 IMPLANT
EXTRACTOR VACUUM M CUP 4 TUBE (SUCTIONS) IMPLANT
EXTRACTOR VACUUM M CUP 4' TUBE (SUCTIONS)
GLOVE BIO SURGEON STRL SZ 6.5 (GLOVE) ×2 IMPLANT
GLOVE BIO SURGEONS STRL SZ 6.5 (GLOVE) ×1
GLOVE BIOGEL PI IND STRL 7.0 (GLOVE) ×2 IMPLANT
GLOVE BIOGEL PI INDICATOR 7.0 (GLOVE) ×4
GOWN STRL REUS W/TWL LRG LVL3 (GOWN DISPOSABLE) ×6 IMPLANT
KIT ABG SYR 3ML LUER SLIP (SYRINGE) IMPLANT
LIQUID BAND (GAUZE/BANDAGES/DRESSINGS) ×3 IMPLANT
NDL HYPO 25X5/8 SAFETYGLIDE (NEEDLE) IMPLANT
NEEDLE HYPO 25X5/8 SAFETYGLIDE (NEEDLE) IMPLANT
NS IRRIG 1000ML POUR BTL (IV SOLUTION) ×3 IMPLANT
PACK C SECTION WH (CUSTOM PROCEDURE TRAY) ×3 IMPLANT
PAD OB MATERNITY 4.3X12.25 (PERSONAL CARE ITEMS) ×3 IMPLANT
PENCIL SMOKE EVAC W/HOLSTER (ELECTROSURGICAL) ×3 IMPLANT
STRIP CLOSURE SKIN 1/2X4 (GAUZE/BANDAGES/DRESSINGS) ×1 IMPLANT
SUT CHROMIC 0 CT 802H (SUTURE) IMPLANT
SUT CHROMIC 0 CTX 36 (SUTURE) ×12 IMPLANT
SUT CHROMIC 2 0 SH (SUTURE) ×2 IMPLANT
SUT MON AB-0 CT1 36 (SUTURE) ×3 IMPLANT
SUT PDS AB 0 CTX 60 (SUTURE) ×3 IMPLANT
SUT PLAIN 0 NONE (SUTURE) IMPLANT
SUT VIC AB 4-0 KS 27 (SUTURE) ×2 IMPLANT
SYR BULB 3OZ (MISCELLANEOUS) ×3 IMPLANT
TOWEL OR 17X24 6PK STRL BLUE (TOWEL DISPOSABLE) ×3 IMPLANT
TRAY FOLEY CATH SILVER 14FR (SET/KITS/TRAYS/PACK) IMPLANT

## 2016-01-30 NOTE — H&P (Signed)
Diana Burnett is a 30 y.o. female presenting for labor; previous C/S x 1 with desire for repeat.  The patient reports LOF for several weeks but always ruled out rupture.  She felt increased leakage today and CTX started tonight.  No VB and active FM.  Antepartum course uncomplicated.  Hypertension in last pregnancy.  Maternal Medical History:  Reason for admission: Rupture of membranes and contractions.   Contractions: Onset was 1-2 hours ago.   Frequency: regular.   Perceived severity is moderate.    Fetal activity: Perceived fetal activity is normal.   Last perceived fetal movement was within the past hour.    Prenatal complications: no prenatal complications Prenatal Complications - Diabetes: none.    OB History    Gravida Para Term Preterm AB TAB SAB Ectopic Multiple Living   Past Medical History  Diagnosis Date  . Medical history non-contributory   . Pregnancy induced hypertension   . GERD (gastroesophageal reflux disease)    Past Surgical History  Procedure Laterality Date  . Cesarean section     Family History: family history is not on file. Social History:  reports that she has never smoked. She does not have any smokeless tobacco history on file. She reports that she does not drink alcohol or use illicit drugs.   Prenatal Transfer Tool  Maternal Diabetes: No Genetic Screening: Normal Maternal Ultrasounds/Referrals: Normal Fetal Ultrasounds or other Referrals:  None Maternal Substance Abuse:  No Significant Maternal Medications:  None Significant Maternal Lab Results:  None Other Comments:  None  ROS  Dilation: 1 Effacement (%): 80 Station: -1 Exam by:: K.Wilson,RN Blood pressure 139/93, pulse 87, temperature 97.7 F (36.5 C), temperature source Oral, resp. rate 20, weight 100.699 kg (222 lb), SpO2 98 %. Maternal Exam:  Uterine Assessment: Contraction strength is moderate.  Contraction frequency is regular.   Abdomen: Patient  reports no abdominal tenderness. Surgical scars: low transverse.   Fundal height is c/w dates.   Estimated fetal weight is 6#4.       Physical Exam  Constitutional: She is oriented to person, place, and time. She appears well-developed and well-nourished.  GI: Soft. There is no rebound and no guarding.  Neurological: She is alert and oriented to person, place, and time.  Skin: Skin is warm and dry.  Psychiatric: She has a normal mood and affect. Her behavior is normal.    Prenatal labs: ABO, Rh:   Antibody:   Rubella:   RPR:    HBsAg:    HIV:    GBS:     Assessment/Plan: 29yo G2P1001 at [redacted]w[redacted]d with labor, previous C/S -Patient declines TOLAC; Will proceed with repeat C/S.  Patient is counseled for repeat C/S including risk of bleeding, infection, scarring and damage to surrounding structures.  All questions were answered and the patient wishes to proceed.   Malin Cervini 01/30/2016, 9:48 PM

## 2016-01-30 NOTE — Anesthesia Preprocedure Evaluation (Addendum)
Anesthesia Evaluation  Patient identified by MRN, date of birth, ID band Patient awake    Reviewed: Allergy & Precautions, NPO status , Patient's Chart, lab work & pertinent test results  Airway Mallampati: II  TM Distance: >3 FB Neck ROM: Full    Dental  (+) Teeth Intact   Pulmonary neg pulmonary ROS,    breath sounds clear to auscultation       Cardiovascular negative cardio ROS   Rhythm:Regular Rate:Normal     Neuro/Psych negative neurological ROS     GI/Hepatic Neg liver ROS, GERD  ,  Endo/Other  negative endocrine ROS  Renal/GU negative Renal ROS     Musculoskeletal   Abdominal   Peds  Hematology negative hematology ROS (+)   Anesthesia Other Findings   Reproductive/Obstetrics                           Lab Results  Component Value Date   WBC 15.0* 01/30/2016   HGB 13.6 01/30/2016   HCT 38.3 01/30/2016   MCV 89.3 01/30/2016   PLT 149* 01/30/2016    Anesthesia Physical Anesthesia Plan  ASA: II  Anesthesia Plan: MAC and Spinal   Post-op Pain Management:    Induction:   Airway Management Planned: Natural Airway and Nasal Cannula  Additional Equipment:   Intra-op Plan:   Post-operative Plan:   Informed Consent: I have reviewed the patients History and Physical, chart, labs and discussed the procedure including the risks, benefits and alternatives for the proposed anesthesia with the patient or authorized representative who has indicated his/her understanding and acceptance.     Plan Discussed with: CRNA and Surgeon  Anesthesia Plan Comments:         Anesthesia Quick Evaluation

## 2016-01-30 NOTE — Op Note (Signed)
Cindee Lame PROCEDURE DATE: 01/30/2016  PREOPERATIVE DIAGNOSIS: Intrauterine pregnancy at  [redacted]w[redacted]d weeks gestation, labor, previous C/S x 1  POSTOPERATIVE DIAGNOSIS: The same  PROCEDURE:  Repeat  Low Transverse Cesarean Section  SURGEON:  Dr. Mitchel Honour  INDICATIONS: JOSELINE MCCAMPBELL is a 30 y.o. G2P1001 at [redacted]w[redacted]d scheduled for cesarean section secondary to labor and desire for repeat.  The risks of cesarean section discussed with the patient included but were not limited to: bleeding which may require transfusion or reoperation; infection which may require antibiotics; injury to bowel, bladder, ureters or other surrounding organs; injury to the fetus; need for additional procedures including hysterectomy in the event of a life-threatening hemorrhage; placental abnormalities wth subsequent pregnancies, incisional problems, thromboembolic phenomenon and other postoperative/anesthesia complications. The patient concurred with the proposed plan, giving informed written consent for the procedure.    FINDINGS:  Viable female infant in breech presentation, APGARs 9,9:  Weight pending  Clear amniotic fluid.  Intact placenta, three vessel cord.  Grossly normal uterus, ovaries and fallopian tubes. .   ANESTHESIA:    Spinal ESTIMATED BLOOD LOSS: 1000 ml SPECIMENS: Placenta sent to pathology COMPLICATIONS: None immediate  PROCEDURE IN DETAIL:  The patient received intravenous antibiotics and had sequential compression devices applied to her lower extremities while in the preoperative area.  She was then taken to the operating room where spinal anesthesia was administered and was found to be adequate. She was then placed in a dorsal supine position with a leftward tilt, and prepped and draped in a sterile manner.  A foley catheter was placed into her bladder and attached to constant gravity.  After an adequate timeout was performed, a Pfannenstiel skin incision was made with scalpel and carried  through to the underlying layer of fascia. The fascia was incised in the midline and this incision was extended bilaterally using the Mayo scissors. Kocher clamps were applied to the superior aspect of the fascial incision and the underlying rectus muscles were dissected off bluntly. A similar process was carried out on the inferior aspect of the facial incision. The rectus muscles were separated in the midline bluntly and the peritoneum was entered bluntly.   A bladder flap was created sharply and developed bluntly.  A transverse hysterotomy was made with a scalpel and extended bilaterally bluntly. The bladder blade was then removed. The infant was successfully delivered using typical breech maneuvers, and cord was clamped and cut and infant was handed over to awaiting neonatology team. Uterine massage was then administered and the placenta delivered intact with three-vessel cord. The uterus was cleared of clot and debris.  The hysterotomy was closed with 0 chromic.  A second imbricating suture of 0-chromic was used to reinforce the incision and aid in hemostasis.  The peritoneum and rectus muscles were noted to be hemostatic and were reapproximated using 3-0 monocryl.  The fascia was closed with 0-PDS in a running fashion with good restoration of anatomy.  The subcutaneus tissue was copiously irrigated.  The skin was closed with 4-0 vicryl in a subcuticular fashion.  Pt tolerated the procedure will.  All counts were correct x2.  Pt went to the recovery room in stable condition.

## 2016-01-30 NOTE — Anesthesia Procedure Notes (Signed)
Spinal Patient location during procedure: OR Staffing Anesthesiologist: Suzette Battiest Performed by: anesthesiologist  Preanesthetic Checklist Completed: patient identified, site marked, surgical consent, pre-op evaluation, timeout performed, IV checked, risks and benefits discussed and monitors and equipment checked Spinal Block Patient position: sitting Prep: DuraPrep Patient monitoring: heart rate, continuous pulse ox and blood pressure Approach: midline Location: L4-5 Injection technique: single-shot Needle Needle type: Pencan  Needle gauge: 24 G Needle length: 9 cm Assessment Sensory level: T6 Additional Notes Expiration date of kit checked and confirmed. Patient tolerated procedure well, without complications.

## 2016-01-30 NOTE — MAU Note (Signed)
Pt reports she has been having ctx on and off all afternoon. Reprots some bloody mucus. And good fetal movement

## 2016-01-31 ENCOUNTER — Encounter (HOSPITAL_COMMUNITY): Payer: Self-pay | Admitting: *Deleted

## 2016-01-31 DIAGNOSIS — Z98891 History of uterine scar from previous surgery: Secondary | ICD-10-CM

## 2016-01-31 LAB — COMPREHENSIVE METABOLIC PANEL
ALT: 15 U/L (ref 14–54)
AST: 26 U/L (ref 15–41)
Albumin: 2.1 g/dL — ABNORMAL LOW (ref 3.5–5.0)
Alkaline Phosphatase: 93 U/L (ref 38–126)
Anion gap: 5 (ref 5–15)
BUN: 11 mg/dL (ref 6–20)
CHLORIDE: 106 mmol/L (ref 101–111)
CO2: 24 mmol/L (ref 22–32)
CREATININE: 0.7 mg/dL (ref 0.44–1.00)
Calcium: 7.6 mg/dL — ABNORMAL LOW (ref 8.9–10.3)
GFR calc non Af Amer: >60 mL/min (ref >60)
Glucose, Bld: 86 mg/dL (ref 65–99)
POTASSIUM: 4.2 mmol/L (ref 3.5–5.1)
SODIUM: 135 mmol/L (ref 135–145)
Total Bilirubin: 0.7 mg/dL (ref 0.3–1.2)
Total Protein: 4.9 g/dL — ABNORMAL LOW (ref 6.5–8.1)

## 2016-01-31 LAB — CBC
HCT: 26.5 % — ABNORMAL LOW (ref 36.0–46.0)
Hemoglobin: 9.2 g/dL — ABNORMAL LOW (ref 12.0–15.0)
MCH: 31 pg (ref 26.0–34.0)
MCHC: 34.7 g/dL (ref 30.0–36.0)
MCV: 89.2 fL (ref 78.0–100.0)
PLATELETS: 108 10*3/uL — AB (ref 150–400)
RBC: 2.97 MIL/uL — AB (ref 3.87–5.11)
RDW: 12.6 % (ref 11.5–15.5)
WBC: 13.2 10*3/uL — AB (ref 4.0–10.5)

## 2016-01-31 LAB — RPR: RPR: NONREACTIVE

## 2016-01-31 MED ORDER — LACTATED RINGERS IV SOLN
INTRAVENOUS | Status: DC
  Administered 2016-01-31: 06:00:00 via INTRAVENOUS

## 2016-01-31 MED ORDER — NALBUPHINE HCL 10 MG/ML IJ SOLN: 5.0000 mg | Freq: Once | INTRAMUSCULAR | Status: DC | PRN

## 2016-01-31 MED ORDER — OXYCODONE-ACETAMINOPHEN 5-325 MG PO TABS
1.0000 | ORAL_TABLET | ORAL | Status: DC | PRN
  Administered 2016-01-31: 0.5 via ORAL
  Administered 2016-01-31: 1 via ORAL
  Administered 2016-01-31: 0.5 via ORAL
  Filled 2016-01-31 (×3): qty 1

## 2016-01-31 MED ORDER — DIPHENHYDRAMINE HCL 25 MG PO CAPS: 25.0000 mg | ORAL_CAPSULE | Freq: Four times a day (QID) | ORAL | Status: DC | PRN

## 2016-01-31 MED ORDER — SENNOSIDES-DOCUSATE SODIUM 8.6-50 MG PO TABS
2.0000 | ORAL_TABLET | ORAL | Status: DC
  Administered 2016-01-31 – 2016-02-02 (×3): 2 via ORAL
  Filled 2016-01-31 (×3): qty 2

## 2016-01-31 MED ORDER — SIMETHICONE 80 MG PO CHEW: 80.0000 mg | CHEWABLE_TABLET | ORAL | Status: DC | PRN

## 2016-01-31 MED ORDER — LACTATED RINGERS IV SOLN: 2.5000 [IU]/h | INTRAVENOUS | Status: AC

## 2016-01-31 MED ORDER — PRENATAL MULTIVITAMIN CH
1.0000 | ORAL_TABLET | Freq: Every day | ORAL | Status: DC
  Administered 2016-01-31 – 2016-02-03 (×4): 1 via ORAL
  Filled 2016-01-31 (×4): qty 1

## 2016-01-31 MED ORDER — ACETAMINOPHEN 325 MG PO TABS
650.0000 mg | ORAL_TABLET | ORAL | Status: DC | PRN
  Administered 2016-01-31 – 2016-02-02 (×13): 650 mg via ORAL
  Filled 2016-01-31 (×14): qty 2

## 2016-01-31 MED ORDER — NALBUPHINE HCL 10 MG/ML IJ SOLN: 5.0000 mg | INTRAMUSCULAR | Status: DC | PRN

## 2016-01-31 MED ORDER — OXYCODONE HCL 5 MG PO TABS
5.0000 mg | ORAL_TABLET | Freq: Four times a day (QID) | ORAL | Status: DC | PRN
  Administered 2016-01-31 – 2016-02-03 (×10): 5 mg via ORAL
  Filled 2016-01-31 (×11): qty 1

## 2016-01-31 MED ORDER — ZOLPIDEM TARTRATE 5 MG PO TABS: 5.0000 mg | ORAL_TABLET | Freq: Every evening | ORAL | Status: DC | PRN

## 2016-01-31 MED ORDER — DIPHENHYDRAMINE HCL 25 MG PO CAPS: 25.0000 mg | ORAL_CAPSULE | ORAL | Status: DC | PRN

## 2016-01-31 MED ORDER — KETOROLAC TROMETHAMINE 30 MG/ML IJ SOLN: 30.0000 mg | Freq: Four times a day (QID) | INTRAMUSCULAR | Status: DC | PRN

## 2016-01-31 MED ORDER — NALOXONE HCL 2 MG/2ML IJ SOSY
1.0000 ug/kg/h | PREFILLED_SYRINGE | INTRAVENOUS | Status: DC | PRN
  Filled 2016-01-31: qty 2

## 2016-01-31 MED ORDER — KETOROLAC TROMETHAMINE 30 MG/ML IJ SOLN
INTRAMUSCULAR | Status: AC
  Administered 2016-01-31: 30 mg via INTRAVENOUS
  Filled 2016-01-31: qty 1

## 2016-01-31 MED ORDER — DIPHENHYDRAMINE HCL 50 MG/ML IJ SOLN: 12.5000 mg | INTRAMUSCULAR | Status: DC | PRN

## 2016-01-31 MED ORDER — LANOLIN HYDROUS EX OINT: 1.0000 "application " | TOPICAL_OINTMENT | CUTANEOUS | Status: DC | PRN

## 2016-01-31 MED ORDER — IBUPROFEN 600 MG PO TABS
600.0000 mg | ORAL_TABLET | Freq: Four times a day (QID) | ORAL | Status: DC
  Administered 2016-02-03: 600 mg via ORAL
  Filled 2016-01-31 (×4): qty 1

## 2016-01-31 MED ORDER — ONDANSETRON HCL 4 MG/2ML IJ SOLN: 4.0000 mg | Freq: Three times a day (TID) | INTRAMUSCULAR | Status: DC | PRN

## 2016-01-31 MED ORDER — MEPERIDINE HCL 25 MG/ML IJ SOLN: 6.2500 mg | INTRAMUSCULAR | Status: DC | PRN

## 2016-01-31 MED ORDER — SCOPOLAMINE 1 MG/3DAYS TD PT72
1.0000 | MEDICATED_PATCH | Freq: Once | TRANSDERMAL | Status: DC
  Filled 2016-01-31: qty 1

## 2016-01-31 MED ORDER — TETANUS-DIPHTH-ACELL PERTUSSIS 5-2.5-18.5 LF-MCG/0.5 IM SUSP: 0.5000 mL | Freq: Once | INTRAMUSCULAR | Status: DC

## 2016-01-31 MED ORDER — SIMETHICONE 80 MG PO CHEW
80.0000 mg | CHEWABLE_TABLET | ORAL | Status: DC
  Administered 2016-01-31 – 2016-02-02 (×4): 80 mg via ORAL
  Filled 2016-01-31 (×3): qty 1

## 2016-01-31 MED ORDER — MENTHOL 3 MG MT LOZG: 1.0000 | LOZENGE | OROMUCOSAL | Status: DC | PRN

## 2016-01-31 MED ORDER — SODIUM CHLORIDE 0.9% FLUSH: 3.0000 mL | INTRAVENOUS | Status: DC | PRN

## 2016-01-31 MED ORDER — SIMETHICONE 80 MG PO CHEW
80.0000 mg | CHEWABLE_TABLET | Freq: Three times a day (TID) | ORAL | Status: DC
  Administered 2016-01-31 – 2016-02-03 (×7): 80 mg via ORAL
  Filled 2016-01-31 (×9): qty 1

## 2016-01-31 MED ORDER — NALOXONE HCL 0.4 MG/ML IJ SOLN: 0.4000 mg | INTRAMUSCULAR | Status: DC | PRN

## 2016-01-31 MED ORDER — OXYCODONE-ACETAMINOPHEN 5-325 MG PO TABS: 2.0000 | ORAL_TABLET | ORAL | Status: DC | PRN

## 2016-01-31 MED ORDER — DIBUCAINE 1 % RE OINT: 1.0000 "application " | TOPICAL_OINTMENT | RECTAL | Status: DC | PRN

## 2016-01-31 MED ORDER — NALBUPHINE HCL 10 MG/ML IJ SOLN
5.0000 mg | INTRAMUSCULAR | Status: DC | PRN
Start: 2016-01-31 — End: 2016-02-03

## 2016-01-31 MED ORDER — WITCH HAZEL-GLYCERIN EX PADS
1.0000 | MEDICATED_PAD | CUTANEOUS | Status: DC | PRN
Start: 2016-01-31 — End: 2016-02-03

## 2016-01-31 MED ORDER — KETOROLAC TROMETHAMINE 30 MG/ML IJ SOLN
30.0000 mg | Freq: Four times a day (QID) | INTRAMUSCULAR | Status: DC | PRN
  Administered 2016-01-31: 30 mg via INTRAVENOUS

## 2016-01-31 NOTE — Anesthesia Postprocedure Evaluation (Signed)
Anesthesia Post Note  Patient: Diana Burnett  Procedure(s) Performed: Procedure(s) (LRB): CESAREAN SECTION (N/A)  Patient location during evaluation: Mother Baby Anesthesia Type: Spinal Level of consciousness: awake and alert and oriented Pain management: satisfactory to patient Vital Signs Assessment: post-procedure vital signs reviewed and stable Respiratory status: respiratory function stable and spontaneous breathing Cardiovascular status: blood pressure returned to baseline Postop Assessment: no headache, no backache, spinal receding, patient able to bend at knees and adequate PO intake Anesthetic complications: no    Last Vitals:  Filed Vitals:   01/31/16 0530 01/31/16 0914  BP: 118/72 116/74  Pulse: 85 91  Temp: 36.8 C 36.4 C  Resp: 18 18    Last Pain:  Filed Vitals:   01/31/16 0924  PainSc: 3                  Drewey Begue

## 2016-01-31 NOTE — Lactation Note (Signed)
This note was copied from a baby's chart. Lactation Consultation Note Called to PACU to assist in latching or hand expressing mom w/4.15 oz. W/inverted nipples. Breast massaged for colostrum, obtained 0.25 ml est. Baby was rooting and eager to feed. Took #20 NS and fitted well. Hand pump given to pre-pump to evert nipple for NS application. Rt. Nipple before pumping, you can hardly tell where the nipple is at this time.  Mom has a 30 yr old and she pumped abd bottle fed d/t baby was transferred to NICU.  Educated about LPI newborn behavior and information sheet given. reviewed I&O, STS. Cluster feeding, supply and demand. Since baby is small discussed supplementing formula to baby until has more colostrum to give baby. Mom is fine with that. Mom encouraged to feed baby 8-12 times/24 hours and with feeding cues. Mom encouraged to waken baby for feeds. Not let baby feed over 30 min,. Referred to Baby and Me Book in Breastfeeding section Pg. 22-23 for position options and Proper latch demonstration. Mom shown how to use DEBP  For breast stimulation & how to disassemble, clean, & reassemble parts. Mom knows to pump q3h for 15-20 min. WH/LC brochure given w/resources, support groups and LC services. Patient Name: Diana Burnett [id number] Date: 01/31/2016 Reason for consult: Initial assessment   Maternal Data Has patient been taught Hand Expression?: Yes Does the patient have breastfeeding experience prior to this delivery?: Yes  Feeding Feeding Type: Breast Milk Length of feed:  (few sucks)  LATCH Score/Interventions Latch: Repeated attempts needed to sustain latch, nipple held in mouth throughout feeding, stimulation needed to elicit sucking reflex. Intervention(s): Adjust position;Assist with latch  Audible Swallowing: None Intervention(s): Skin to skin;Hand expression  Type of Nipple: Inverted Intervention(s): Hand pump  Comfort (Breast/Nipple): Soft / non-tender     Hold  (Positioning): Full assist, staff holds infant at breast Intervention(s): Skin to skin  LATCH Score: 3  Lactation Tools Discussed/Used Tools: Pump Breast pump type: Manual Initiated by:: Peri Jefferson RN Date initiated:: 01/31/16   Consult Status Consult Status: Follow-up Date: 01/31/16 Follow-up type: In-patient    Terral Cooks, Diamond Nickel 01/31/2016, 2:24 AM

## 2016-01-31 NOTE — Anesthesia Postprocedure Evaluation (Signed)
Anesthesia Post Note  Patient: Diana Burnett  Procedure(s) Performed: Procedure(s) (LRB): CESAREAN SECTION (N/A)  Patient location during evaluation: PACU Anesthesia Type: Spinal Level of consciousness: awake and alert Pain management: pain level controlled Vital Signs Assessment: post-procedure vital signs reviewed and stable Respiratory status: spontaneous breathing Cardiovascular status: blood pressure returned to baseline Postop Assessment: spinal receding Anesthetic complications: no    Last Vitals:  Filed Vitals:   01/31/16 0230 01/31/16 0340  BP: 115/77 117/72  Pulse: 88 97  Temp: 36.8 C 37.3 C  Resp: 18 18    Last Pain:  Filed Vitals:   01/31/16 0341  PainSc: 0-No pain                 Kennieth Rad

## 2016-01-31 NOTE — Progress Notes (Signed)
Subjective: Postpartum Day 1: Cesarean Delivery Patient reports tolerating PO.    Objective: Vital signs in last 24 hours: Temp:  [97.7 F (36.5 C)-99.1 F (37.3 C)] 98.3 F (36.8 C) (02/24 0530) Pulse Rate:  [74-97] 85 (02/24 0530) Resp:  [14-23] 18 (02/24 0530) BP: (104-147)/(66-95) 118/72 mmHg (02/24 0530) SpO2:  [95 %-99 %] 95 % (02/24 0530) Weight:  [222 lb (100.699 kg)] 222 lb (100.699 kg) (02/23 2202)  Physical Exam:  General: alert and cooperative Lochia: appropriate Uterine Fundus: firm Incision: healing well DVT Evaluation: No evidence of DVT seen on physical exam. Negative Homan's sign. No cords or calf tenderness. No significant calf/ankle edema.   Recent Labs  01/30/16 2145 01/31/16 0600  HGB 13.6 9.2*  HCT 38.3 26.5*    Assessment/Plan: Status post Cesarean section. Doing well postoperatively.  CBC ordered for am. And CMP. H/o PIH with last pregnancy.  Diana Burnett G 01/31/2016, 8:18 AM

## 2016-01-31 NOTE — Transfer of Care (Signed)
Immediate Anesthesia Transfer of Care Note  Patient: Diana Burnett  Procedure(s) Performed: Procedure(s) with comments: CESAREAN SECTION (N/A) - MD requests RNFA RNFA confirmed on 01/30/16 with Chassity and Murrell Converse  Patient Location: PACU  Anesthesia Type:Spinal  Level of Consciousness: awake  Airway & Oxygen Therapy: Patient Spontanous Breathing  Post-op Assessment: Report given to RN and Post -op Vital signs reviewed and stable  Post vital signs: stable  Last Vitals:  Filed Vitals:   01/30/16 2116 01/30/16 2117  BP: 135/95 139/93  Pulse: 75 87  Temp:    Resp:      Complications: No apparent anesthesia complications

## 2016-01-31 NOTE — Lactation Note (Signed)
This note was copied from a baby's chart. Lactation Consultation Note Assisting in latching, collecting 2 ml colostrum, gave to baby w/curve tip syring while BF w/#20 NS. Taught application of NS. Mom very sleepy but attentive, FOB attentive and supportive. Gave 3 ml formula 22 cal. Similac while feeding on NS. Bld sugar low 30. RN at bedside for intervention. Noted lots of transitional milk in NS. Noted breast heavy feeling a little hard in center of breast. Noted softening of berast after BF. Only let baby BF for 15 min. D/t low bld sugar. Mom had been doing STS.  Patient Name: Diana Burnett [id number] Date: 01/31/2016 Reason for consult: Follow-up assessment;Infant < 6lbs;Late preterm infant;Other (Comment) (low blood sugar)   Maternal Data    Feeding Feeding Type: Breast Milk with Formula added Nipple Type: Slow - flow  LATCH Score/Interventions Latch: Grasps breast easily, tongue down, lips flanged, rhythmical sucking. Intervention(s): Adjust position;Assist with latch;Breast massage;Breast compression  Audible Swallowing: Spontaneous and intermittent Intervention(s): Skin to skin Intervention(s): Skin to skin;Hand expression;Alternate breast massage  Type of Nipple: Inverted Intervention(s): Shells;Hand pump;Double electric pump  Comfort (Breast/Nipple): Soft / non-tender     Hold (Positioning): Full assist, staff holds infant at breast Intervention(s): Breastfeeding basics reviewed;Support Pillows;Position options;Skin to skin  LATCH Score: 6  Lactation Tools Discussed/Used Tools: Shells;Nipple Shields;Pump;Bottle Nipple shield size: 20 Shell Type: Inverted Breast pump type: Double-Electric Breast Pump WIC Program: No   Consult Status Consult Status: Follow-up Date: 01/31/16 Follow-up type: In-patient    Charyl Dancer 01/31/2016, 5:37 AM

## 2016-01-31 NOTE — Progress Notes (Signed)
Mother's first ambulation delayed due to her feeling light headed and dizzy. Mother encouraged to drink juice, eat, and rest. Mother to call when she feels better.

## 2016-01-31 NOTE — Addendum Note (Signed)
Addendum  created 01/31/16 1000 by Graciela Husbands, CRNA   Modules edited: Notes Section   Notes Section:  File: [id number]

## 2016-01-31 NOTE — Lactation Note (Signed)
This note was copied from a baby's chart. Lactation Consultation Note  Patient Name: Diana Burnett [id number] Date: 01/31/2016 Reason for consult: Follow-up assessment   With this mom of a now 36 1/[redacted] week gestation baby, now 24 hours old, and recently transferred to NICU for hypoglycemia. The baby is small, weighing 4 lbs 14.3  Oz. I briefly reviewed with mom pumping frequency and duration, hand expression and it's importance, the NICU booklet and lactation services. Mom was on her way to the nICU, and teaching follow up will be needed. Mom's first baby went to the NICU also, and is now 67 years old. Mom knows to call for lactation for questions/concerns.    Maternal Data    Feeding Feeding Type: Bottle Fed - Formula Nipple Type: Slow - flow  LATCH Score/Interventions                      Lactation Tools Discussed/Used     Consult Status Consult Status: Follow-up Date: 02/01/16 Follow-up type: In-patient    Alfred Levins 01/31/2016, 4:08 PM

## 2016-02-01 LAB — CBC
HCT: 27.7 % — ABNORMAL LOW (ref 36.0–46.0)
Hemoglobin: 9.4 g/dL — ABNORMAL LOW (ref 12.0–15.0)
MCH: 31.1 pg (ref 26.0–34.0)
MCHC: 33.9 g/dL (ref 30.0–36.0)
MCV: 91.7 fL (ref 78.0–100.0)
PLATELETS: 103 10*3/uL — AB (ref 150–400)
RBC: 3.02 MIL/uL — ABNORMAL LOW (ref 3.87–5.11)
RDW: 13 % (ref 11.5–15.5)
WBC: 13 10*3/uL — ABNORMAL HIGH (ref 4.0–10.5)

## 2016-02-01 MED ORDER — CALCIUM CARBONATE ANTACID 500 MG PO CHEW
1.0000 | CHEWABLE_TABLET | Freq: Three times a day (TID) | ORAL | Status: DC | PRN
  Administered 2016-02-01 – 2016-02-02 (×3): 200 mg via ORAL
  Filled 2016-02-01 (×3): qty 1

## 2016-02-01 MED ORDER — FAMOTIDINE 20 MG PO TABS
20.0000 mg | ORAL_TABLET | Freq: Two times a day (BID) | ORAL | Status: DC
  Administered 2016-02-01 – 2016-02-03 (×5): 20 mg via ORAL
  Filled 2016-02-01 (×4): qty 1

## 2016-02-01 NOTE — Progress Notes (Signed)
Subjective: Postpartum Day 2: Cesarean Delivery Patient reports tolerating PO, + flatus and no problems voiding.   Baby in NICU with unstable glucose  Objective: Vital signs in last 24 hours: Temp:  [97.5 F (36.4 C)-99.4 F (37.4 C)] 97.8 F (36.6 C) (02/25 0517) Pulse Rate:  [80-100] 80 (02/25 0517) Resp:  [18] 18 (02/25 0517) BP: (111-131)/(69-89) 111/73 mmHg (02/25 0517) SpO2:  [95 %-98 %] 95 % (02/24 1439)  Physical Exam:  General: alert, cooperative and no distress Lochia: appropriate Uterine Fundus: firm Incision: healing well DVT Evaluation: No evidence of DVT seen on physical exam.   Recent Labs  01/30/16 2145 01/31/16 0600  HGB 13.6 9.2*  HCT 38.3 26.5*    Assessment/Plan: Status post Cesarean section. Doing well postoperatively.  Continue current care. CBC pending  Hajar Penninger II,Zhaire Locker E 02/01/2016, 8:49 AM

## 2016-02-01 NOTE — Lactation Note (Signed)
This note was copied from a baby's chart. Lactation Consultation Note  Patient Name: Diana Burnett [id number] Date: 02/01/2016 Reason for consult: Follow-up assessment;NICU baby;Infant < 6lbs;Late preterm infant Baby is 57 hours old & seen by [facility] for follow-up assessment. Mom was eating when LC entered & reports that she had recently finished pumping & had received more milk than previously (~45mL). Mom reports she has been pumping q 3hrs (sometimes a little longer- for example, she had just come back from the NICU & so it had been ~3.5hrs since last pumping). Mom reported she feels her breasts are getting fuller but not hard/ sore. Mom stated she has not done any skin-to-skin or BF attempts since baby had gone to the NICU. Mom reports no questions at this time. Encouraged mom to ask for [facility] if she has any questions.  Maternal Data    Feeding Feeding Type: Formula Nipple Type: Slow - flow Length of feed: 15 min  LATCH Score/Interventions                      Lactation Tools Discussed/Used     Consult Status Consult Status: Follow-up Date: 02/02/16 Follow-up type: In-patient    [name] 02/01/2016, 1:22 PM

## 2016-02-02 NOTE — Progress Notes (Signed)
POD #3  Patient out of room  VS BP 135/93, 131/95, 133/83  Results for orders placed or performed during the hospital encounter of 01/30/16 (from the past 48 hour(s))  Comprehensive metabolic panel     Status: Abnormal   Collection Time: 01/31/16  8:56 AM  Result Value Ref Range   Sodium 135 135 - 145 mmol/L   Potassium 4.2 3.5 - 5.1 mmol/L   Chloride 106 101 - 111 mmol/L   CO2 24 22 - 32 mmol/L   Glucose, Bld 86 65 - 99 mg/dL   BUN 11 6 - 20 mg/dL   Creatinine, Ser 0.70 0.44 - 1.00 mg/dL   Calcium 7.6 (L) 8.9 - 10.3 mg/dL   Total Protein 4.9 (L) 6.5 - 8.1 g/dL   Albumin 2.1 (L) 3.5 - 5.0 g/dL   AST 26 15 - 41 U/L   ALT 15 14 - 54 U/L   Alkaline Phosphatase 93 38 - 126 U/L   Total Bilirubin 0.7 0.3 - 1.2 mg/dL   GFR calc non Af Amer >60 >60 mL/min   GFR calc Af Amer >60 >60 mL/min    Comment: (NOTE) The eGFR has been calculated using the CKD EPI equation. This calculation has not been validated in all clinical situations. eGFR's persistently <60 mL/min signify possible Chronic Kidney Disease.    Anion gap 5 5 - 15  CBC     Status: Abnormal   Collection Time: 02/01/16  7:40 AM  Result Value Ref Range   WBC 13.0 (H) 4.0 - 10.5 K/uL   RBC 3.02 (L) 3.87 - 5.11 MIL/uL   Hemoglobin 9.4 (L) 12.0 - 15.0 g/dL   HCT 27.7 (L) 36.0 - 46.0 %   MCV 91.7 78.0 - 100.0 fL   MCH 31.1 26.0 - 34.0 pg   MCHC 33.9 30.0 - 36.0 g/dL   RDW 13.0 11.5 - 15.5 %   Platelets 103 (L) 150 - 400 K/uL    Comment: REPEATED TO VERIFY SPECIMEN CHECKED FOR CLOTS PLATELET COUNT CONFIRMED BY SMEAR    A/P: Borderline BP with low platelets         Observe         CBC in am

## 2016-02-02 NOTE — Lactation Note (Addendum)
This note was copied from a baby's chart. Lactation Consultation Note  Patient Name: Diana Burnett Date: 02/02/2016 Reason for consult: Follow-up assessment   With this mom as she was going to NICU to visit her baby. Mom is now 62 hours post partum, and is expressing 30 mls of transitional milk. Mom has not put baby to abreast in NICU, because she fears not knowing how much he took and his hypoglycemia would be effected. I told mom that if she changes her mind, and wants to latch the baby, to call for lactation helps, as needed. Mom advised to pump until she stops dripping now, which she was not doing, 15-30 minutes, every 3 hours.   Maternal Data    Feeding Feeding Type: Breast Milk Nipple Type: Slow - flow Length of feed: 30 min  LATCH Score/Interventions                      Lactation Tools Discussed/Used     Consult Status Consult Status: Follow-up Date: 02/03/16 Follow-up type: In-patient    Alfred Levins 02/02/2016, 1:25 PM

## 2016-02-03 ENCOUNTER — Encounter (HOSPITAL_COMMUNITY): Payer: Self-pay | Admitting: Obstetrics and Gynecology

## 2016-02-03 LAB — CBC
HEMATOCRIT: 24.1 % — AB (ref 36.0–46.0)
Hemoglobin: 8.2 g/dL — ABNORMAL LOW (ref 12.0–15.0)
MCH: 31.3 pg (ref 26.0–34.0)
MCHC: 34 g/dL (ref 30.0–36.0)
MCV: 92 fL (ref 78.0–100.0)
PLATELETS: 120 10*3/uL — AB (ref 150–400)
RBC: 2.62 MIL/uL — ABNORMAL LOW (ref 3.87–5.11)
RDW: 12.8 % (ref 11.5–15.5)
WBC: 6.6 10*3/uL (ref 4.0–10.5)

## 2016-02-03 MED ORDER — OXYCODONE HCL 5 MG PO TABS: 5.0000 mg | ORAL_TABLET | Freq: Four times a day (QID) | ORAL | Status: AC | PRN

## 2016-02-03 MED ORDER — IBUPROFEN 600 MG PO TABS: 600.0000 mg | ORAL_TABLET | Freq: Four times a day (QID) | ORAL | Status: AC

## 2016-02-03 NOTE — Discharge Summary (Signed)
Obstetric Discharge Summary Reason for Admission: onset of labor Prenatal Procedures: ultrasound Intrapartum Procedures: cesarean: low cervical, transverse Postpartum Procedures: none Complications-Operative and Postpartum: none HEMOGLOBIN  Date Value Ref Range Status  02/03/2016 8.2* 12.0 - 15.0 g/dL Final   HCT  Date Value Ref Range Status  02/03/2016 24.1* 36.0 - 46.0 % Final    Physical Exam:  General: alert and cooperative Lochia: appropriate Uterine Fundus: firm Incision: healing well DVT Evaluation: No evidence of DVT seen on physical exam. Negative Homan's sign. No cords or calf tenderness. No significant calf/ankle edema.  Discharge Diagnoses: Term Pregnancy-delivered  Discharge Information: Date: 02/03/2016 Activity: pelvic rest Diet: routine Medications: PNV, Ibuprofen and Percocet Condition: stable Instructions: refer to practice specific booklet Discharge to: home   Newborn Data: Live born female  Birth Weight: 4 lb 15 oz (2240 g) APGAR: 9, 9 Baby in NICU Cyril Railey G 02/03/2016, 8:55 AM

## 2016-02-03 NOTE — Progress Notes (Signed)
CSW acknowledges NICU admission.    Patient screened out for psychosocial assessment since none of the following apply:  Psychosocial stressors documented in mother or baby's chart  Gestation less than 32 weeks  Code at delivery   Infant with anomalies  Please contact the Clinical Social Worker if specific needs arise, or by MOB's request.       

## 2016-02-03 NOTE — Lactation Note (Signed)
This note was copied from a baby's chart. Lactation Consultation Note  Patient Name: Diana Burnett [id number] Date: 02/03/2016 Reason for consult: Follow-up assessment;NICU baby NICU baby 79 hours old. Mom reports that she is pumping about 60 ml of EBM at this point. Mom states that she needs to pump now, and her breasts are becoming engorged. Discussed engorgement prevention/treatment. Enc mom to keep pumping 8 times/24 hours followed by hand expression, and pumping 1-2 minutes after her last let-down. Mom reports that she and FOB will be staying in a hotel close to hospital because they live an hour away. Discussed the benefits of hospital grade pump during first 2 weeks of lactation. Mom states that she has a personal pump and will use DEBP in NICU pumping rooms because she will be here most of the time. Mom states that she is not putting baby to breast in NICU d/t concern for baby's glucose levels, and her EBM is being fortified. Enc mom to offer STS/nuzzling/attempts at breast as baby able. Discussed benefits to baby and breast milk supply.   Mom aware of OP/BFSG and LC phone line assistance after D/C.   Maternal Data    Feeding Feeding Type: Breast Milk Nipple Type: Slow - flow Length of feed: 30 min  LATCH Score/Interventions                      Lactation Tools Discussed/Used Tools: Nipple Dorris Carnes;Shells;Pump   Consult Status Consult Status: PRN    TULSI, CROSSETT 02/03/2016, 9:47 AM

## 2016-02-20 ENCOUNTER — Inpatient Hospital Stay (HOSPITAL_COMMUNITY): Admit: 2016-02-20 | Payer: Commercial Managed Care - PPO | Admitting: Obstetrics and Gynecology
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
